# Patient Record
Sex: Male | Born: 1990 | Race: White | Hispanic: No | Marital: Single | Smoking: Current every day smoker
Health system: Southern US, Community
[De-identification: ages and names within clinical notes are randomized; demographics above are authoritative.]

---

## 2009-03-26 ENCOUNTER — Emergency Department (HOSPITAL_COMMUNITY): Admission: EM | Admit: 2009-03-26 | Discharge: 2009-03-26 | Payer: Self-pay | Admitting: Emergency Medicine

## 2010-12-15 LAB — URINALYSIS, ROUTINE W REFLEX MICROSCOPIC
Glucose, UA: NEGATIVE mg/dL
Leukocytes, UA: NEGATIVE
Nitrite: NEGATIVE
pH: >9 — ABNORMAL HIGH (ref 5.0–8.0)

## 2010-12-15 LAB — DIFFERENTIAL
Basophils Relative: 0 % (ref 0–1)
Eosinophils Absolute: 0 10*3/uL (ref 0.0–0.7)
Eosinophils Relative: 0 % (ref 0–5)
Lymphs Abs: 0.3 10*3/uL — ABNORMAL LOW (ref 0.7–4.0)
Monocytes Absolute: 0.4 10*3/uL (ref 0.1–1.0)
Monocytes Relative: 6 % (ref 3–12)
Neutrophils Relative %: 90 % — ABNORMAL HIGH (ref 43–77)

## 2010-12-15 LAB — CBC
Hemoglobin: 15.5 g/dL (ref 13.0–17.0)
RBC: 4.81 MIL/uL (ref 4.22–5.81)
RDW: 12.2 % (ref 11.5–15.5)

## 2010-12-15 LAB — COMPREHENSIVE METABOLIC PANEL
ALT: 23 U/L (ref 0–53)
AST: 27 U/L (ref 0–37)
Alkaline Phosphatase: 64 U/L (ref 39–117)
CO2: 25 mEq/L (ref 19–32)
Calcium: 9.2 mg/dL (ref 8.4–10.5)
GFR calc Af Amer: >60 mL/min (ref >60)
Glucose, Bld: 115 mg/dL — ABNORMAL HIGH (ref 70–99)
Potassium: 3.5 mEq/L (ref 3.5–5.1)
Sodium: 136 mEq/L (ref 135–145)
Total Protein: 7.1 g/dL (ref 6.0–8.3)

## 2010-12-15 LAB — URINE MICROSCOPIC-ADD ON

## 2011-09-24 ENCOUNTER — Emergency Department (HOSPITAL_COMMUNITY): Payer: BC Managed Care – PPO

## 2011-09-24 ENCOUNTER — Encounter (HOSPITAL_COMMUNITY): Payer: Self-pay | Admitting: *Deleted

## 2011-09-24 ENCOUNTER — Emergency Department (HOSPITAL_COMMUNITY)
Admission: EM | Admit: 2011-09-24 | Discharge: 2011-09-25 | Discharge status: Against Medical Advice | Payer: BC Managed Care – PPO | Attending: Emergency Medicine | Admitting: Emergency Medicine

## 2011-09-24 DIAGNOSIS — M545 Low back pain, unspecified: Secondary | ICD-10-CM | POA: Insufficient documentation

## 2011-09-24 DIAGNOSIS — R109 Unspecified abdominal pain: Secondary | ICD-10-CM | POA: Insufficient documentation

## 2011-09-24 DIAGNOSIS — S60219A Contusion of unspecified wrist, initial encounter: Secondary | ICD-10-CM | POA: Insufficient documentation

## 2011-09-24 DIAGNOSIS — IMO0002 Reserved for concepts with insufficient information to code with codable children: Secondary | ICD-10-CM | POA: Insufficient documentation

## 2011-09-24 DIAGNOSIS — F17200 Nicotine dependence, unspecified, uncomplicated: Secondary | ICD-10-CM | POA: Insufficient documentation

## 2011-09-24 DIAGNOSIS — M542 Cervicalgia: Secondary | ICD-10-CM | POA: Insufficient documentation

## 2011-09-24 DIAGNOSIS — M25539 Pain in unspecified wrist: Secondary | ICD-10-CM | POA: Insufficient documentation

## 2011-09-24 DIAGNOSIS — IMO0001 Reserved for inherently not codable concepts without codable children: Secondary | ICD-10-CM | POA: Insufficient documentation

## 2011-09-24 LAB — DIFFERENTIAL
Basophils Absolute: 0.1 10*3/uL (ref 0.0–0.1)
Basophils Relative: 1 % (ref 0–1)
Eosinophils Absolute: 0.1 10*3/uL (ref 0.0–0.7)
Eosinophils Relative: 1 % (ref 0–5)
Lymphs Abs: 1.6 10*3/uL (ref 0.7–4.0)
Neutrophils Relative %: 73 % (ref 43–77)

## 2011-09-24 LAB — CBC
MCH: 31.5 pg (ref 26.0–34.0)
MCHC: 35.2 g/dL (ref 30.0–36.0)
MCV: 89.4 fL (ref 78.0–100.0)
Platelets: 203 10*3/uL (ref 150–400)
RBC: 4.83 MIL/uL (ref 4.22–5.81)
RDW: 12.7 % (ref 11.5–15.5)
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
## 2011-09-25 LAB — URINALYSIS, ROUTINE W REFLEX MICROSCOPIC
Hgb urine dipstick: NEGATIVE
Nitrite: NEGATIVE
Specific Gravity, Urine: 1.015 (ref 1.005–1.030)
Urobilinogen, UA: 0.2 mg/dL (ref 0.0–1.0)
pH: >9 — ABNORMAL HIGH (ref 5.0–8.0)

## 2011-09-25 LAB — COMPREHENSIVE METABOLIC PANEL
ALT: 9 U/L (ref 0–53)
Albumin: 4.3 g/dL (ref 3.5–5.2)
Alkaline Phosphatase: 53 U/L (ref 39–117)
Calcium: 9.7 mg/dL (ref 8.4–10.5)
GFR calc Af Amer: >90 mL/min (ref >90)
Glucose, Bld: 105 mg/dL — ABNORMAL HIGH (ref 70–99)
Potassium: 3.7 mEq/L (ref 3.5–5.1)
Sodium: 139 mEq/L (ref 135–145)
Total Protein: 7.2 g/dL (ref 6.0–8.3)

## 2011-09-25 LAB — URINE MICROSCOPIC-ADD ON
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
## 2015-04-29 ENCOUNTER — Encounter (HOSPITAL_COMMUNITY): Payer: Self-pay | Admitting: *Deleted

## 2015-04-29 ENCOUNTER — Emergency Department (HOSPITAL_COMMUNITY): Payer: No Typology Code available for payment source

## 2015-04-29 ENCOUNTER — Emergency Department (HOSPITAL_COMMUNITY)
Admission: EM | Admit: 2015-04-29 | Discharge: 2015-04-29 | Disposition: A | Discharge status: Home / Self Care | Payer: No Typology Code available for payment source | Attending: Emergency Medicine | Admitting: Emergency Medicine

## 2015-04-29 DIAGNOSIS — Y92410 Unspecified street and highway as the place of occurrence of the external cause: Secondary | ICD-10-CM | POA: Insufficient documentation

## 2015-04-29 DIAGNOSIS — S199XXA Unspecified injury of neck, initial encounter: Secondary | ICD-10-CM | POA: Insufficient documentation

## 2015-04-29 DIAGNOSIS — Z720 Tobacco use: Secondary | ICD-10-CM | POA: Insufficient documentation

## 2015-04-29 DIAGNOSIS — Y998 Other external cause status: Secondary | ICD-10-CM | POA: Insufficient documentation

## 2015-04-29 DIAGNOSIS — Y9389 Activity, other specified: Secondary | ICD-10-CM | POA: Insufficient documentation

## 2015-04-29 DIAGNOSIS — M542 Cervicalgia: Secondary | ICD-10-CM

## 2015-04-29 MED ORDER — IBUPROFEN 600 MG PO TABS: 600.0000 mg | ORAL_TABLET | Freq: Three times a day (TID) | ORAL | Status: AC | PRN

## 2015-04-29 MED ORDER — IBUPROFEN 400 MG PO TABS
600.0000 mg | ORAL_TABLET | Freq: Once | ORAL | Status: AC
  Administered 2015-04-29: 600 mg via ORAL
  Filled 2015-04-29: qty 2

## 2015-04-29 MED ORDER — CYCLOBENZAPRINE HCL 10 MG PO TABS
10.0000 mg | ORAL_TABLET | Freq: Once | ORAL | Status: AC
  Administered 2015-04-29: 10 mg via ORAL
  Filled 2015-04-29: qty 1
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
## 2015-04-29 NOTE — Discharge Instructions (Signed)

## 2015-05-01 ENCOUNTER — Emergency Department (HOSPITAL_COMMUNITY): Payer: No Typology Code available for payment source

## 2015-05-01 ENCOUNTER — Encounter (HOSPITAL_COMMUNITY): Payer: Self-pay | Admitting: *Deleted

## 2015-05-01 ENCOUNTER — Emergency Department (HOSPITAL_COMMUNITY)
Admission: EM | Admit: 2015-05-01 | Discharge: 2015-05-01 | Disposition: A | Discharge status: Home / Self Care | Payer: No Typology Code available for payment source | Attending: Emergency Medicine | Admitting: Emergency Medicine

## 2015-05-01 DIAGNOSIS — Y9389 Activity, other specified: Secondary | ICD-10-CM | POA: Insufficient documentation

## 2015-05-01 DIAGNOSIS — Y92410 Unspecified street and highway as the place of occurrence of the external cause: Secondary | ICD-10-CM | POA: Insufficient documentation

## 2015-05-01 DIAGNOSIS — S060X1A Concussion with loss of consciousness of 30 minutes or less, initial encounter: Secondary | ICD-10-CM

## 2015-05-01 DIAGNOSIS — Z720 Tobacco use: Secondary | ICD-10-CM | POA: Diagnosis not present

## 2015-05-01 DIAGNOSIS — S239XXA Sprain of unspecified parts of thorax, initial encounter: Secondary | ICD-10-CM | POA: Diagnosis not present

## 2015-05-01 DIAGNOSIS — S161XXA Strain of muscle, fascia and tendon at neck level, initial encounter: Secondary | ICD-10-CM | POA: Insufficient documentation

## 2015-05-01 DIAGNOSIS — S29012A Strain of muscle and tendon of back wall of thorax, initial encounter: Secondary | ICD-10-CM | POA: Insufficient documentation

## 2015-05-01 DIAGNOSIS — Y998 Other external cause status: Secondary | ICD-10-CM | POA: Diagnosis not present

## 2015-05-01 DIAGNOSIS — S233XXA Sprain of ligaments of thoracic spine, initial encounter: Secondary | ICD-10-CM

## 2015-05-01 DIAGNOSIS — S0990XA Unspecified injury of head, initial encounter: Secondary | ICD-10-CM | POA: Diagnosis present
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
## 2015-05-01 NOTE — ED Notes (Signed)
Pt made aware to return if symptoms worsen or if any life threatening symptoms occur.   

## 2015-05-01 NOTE — Discharge Instructions (Signed)
Take acetaminophen, ibuprofen, or naproxen as needed for pain.  Concussion A concussion, or closed-head injury, is a brain injury caused by a direct blow to the head or by a quick and sudden movement (jolt) of the head or neck. Concussions are usually not life-threatening. Even so, the effects of a concussion can be serious. If you have had a concussion before, you are more likely to experience concussion-like symptoms after a direct blow to the head.  CAUSES  Direct blow to the head, such as from running into another player during a soccer game, being hit in a fight, or hitting your head on a hard surface.  A jolt of the head or neck that causes the brain to move back and forth inside the skull, such as in a car crash. SIGNS AND SYMPTOMS The signs of a concussion can be hard to notice. Early on, they may be missed by you, family members, and health care providers. You may look fine but act or feel differently. Symptoms are usually temporary, but they may last for days, weeks, or even longer. Some symptoms may appear right away while others may not show up for hours or days. Every head injury is different. Symptoms include:  Mild to moderate headaches that will not go away.  A feeling of pressure inside your head.  Having more trouble than usual:  Learning or remembering things you have heard.  Answering questions.  Paying attention or concentrating.  Organizing daily tasks.  Making decisions and solving problems.  Slowness in thinking, acting or reacting, speaking, or reading.  Getting lost or being easily confused.  Feeling tired all the time or lacking energy (fatigued).  Feeling drowsy.  Sleep disturbances.  Sleeping more than usual.  Sleeping less than usual.  Trouble falling asleep.  Trouble sleeping (insomnia).  Loss of balance or feeling lightheaded or dizzy.  Nausea or vomiting.  Numbness or tingling.  Increased sensitivity  to:  Sounds.  Lights.  Distractions.  Vision problems or eyes that tire easily.  Diminished sense of taste or smell.  Ringing in the ears.  Mood changes such as feeling sad or anxious.  Becoming easily irritated or angry for little or no reason.  Lack of motivation.  Seeing or hearing things other people do not see or hear (hallucinations). DIAGNOSIS Your health care provider can usually diagnose a concussion based on a description of your injury and symptoms. He or she will ask whether you passed out (lost consciousness) and whether you are having trouble remembering events that happened right before and during your injury. Your evaluation might include:  A brain scan to look for signs of injury to the brain. Even if the test shows no injury, you may still have a concussion.  Blood tests to be sure other problems are not present. TREATMENT  Concussions are usually treated in an emergency department, in urgent care, or at a clinic. You may need to stay in the hospital overnight for further treatment.  Tell your health care provider if you are taking any medicines, including prescription medicines, over-the-counter medicines, and natural remedies. Some medicines, such as blood thinners (anticoagulants) and aspirin, may increase the chance of complications. Also tell your health care provider whether you have had alcohol or are taking illegal drugs. This information may affect treatment.  Your health care provider will send you home with important instructions to follow.  How fast you will recover from a concussion depends on many factors. These factors include how severe your  concussion is, what part of your brain was injured, your age, and how healthy you were before the concussion.  Most people with mild injuries recover fully. Recovery can take time. In general, recovery is slower in older persons. Also, persons who have had a concussion in the past or have other medical  problems may find that it takes longer to recover from their current injury. HOME CARE INSTRUCTIONS General Instructions  Carefully follow the directions your health care provider gave you.  Only take over-the-counter or prescription medicines for pain, discomfort, or fever as directed by your health care provider.  Take only those medicines that your health care provider has approved.  Do not drink alcohol until your health care provider says you are well enough to do so. Alcohol and certain other drugs may slow your recovery and can put you at risk of further injury.  If it is harder than usual to remember things, write them down.  If you are easily distracted, try to do one thing at a time. For example, do not try to watch TV while fixing dinner.  Talk with family members or close friends when making important decisions.  Keep all follow-up appointments. Repeated evaluation of your symptoms is recommended for your recovery.  Watch your symptoms and tell others to do the same. Complications sometimes occur after a concussion. Older adults with a brain injury may have a higher risk of serious complications, such as a blood clot on the brain.  Tell your teachers, school nurse, school counselor, coach, athletic trainer, or work Production designer, theatre/television/film about your injury, symptoms, and restrictions. Tell them about what you can or cannot do. They should watch for:  Increased problems with attention or concentration.  Increased difficulty remembering or learning new information.  Increased time needed to complete tasks or assignments.  Increased irritability or decreased ability to cope with stress.  Increased symptoms.  Rest. Rest helps the brain to heal. Make sure you:  Get plenty of sleep at night. Avoid staying up late at night.  Keep the same bedtime hours on weekends and weekdays.  Rest during the day. Take daytime naps or rest breaks when you feel tired.  Limit activities that require a  lot of thought or concentration. These include:  Doing homework or job-related work.  Watching TV.  Working on the computer.  Avoid any situation where there is potential for another head injury (football, hockey, soccer, basketball, martial arts, downhill snow sports and horseback riding). Your condition will get worse every time you experience a concussion. You should avoid these activities until you are evaluated by the appropriate follow-up health care providers. Returning To Your Regular Activities You will need to return to your normal activities slowly, not all at once. You must give your body and brain enough time for recovery.  Do not return to sports or other athletic activities until your health care provider tells you it is safe to do so.  Ask your health care provider when you can drive, ride a bicycle, or operate heavy machinery. Your ability to react may be slower after a brain injury. Never do these activities if you are dizzy.  Ask your health care provider about when you can return to work or school. Preventing Another Concussion It is very important to avoid another brain injury, especially before you have recovered. In rare cases, another injury can lead to permanent brain damage, brain swelling, or death. The risk of this is greatest during the first 7-10 days after  a head injury. Avoid injuries by:  Wearing a seat belt when riding in a car.  Drinking alcohol only in moderation.  Wearing a helmet when biking, skiing, skateboarding, skating, or doing similar activities.  Avoiding activities that could lead to a second concussion, such as contact or recreational sports, until your health care provider says it is okay.  Taking safety measures in your home.  Remove clutter and tripping hazards from floors and stairways.  Use grab bars in bathrooms and handrails by stairs.  Place non-slip mats on floors and in bathtubs.  Improve lighting in dim areas. SEEK MEDICAL  CARE IF:  You have increased problems paying attention or concentrating.  You have increased difficulty remembering or learning new information.  You need more time to complete tasks or assignments than before.  You have increased irritability or decreased ability to cope with stress.  You have more symptoms than before. Seek medical care if you have any of the following symptoms for more than 2 weeks after your injury:  Lasting (chronic) headaches.  Dizziness or balance problems.  Nausea.  Vision problems.  Increased sensitivity to noise or light.  Depression or mood swings.  Anxiety or irritability.  Memory problems.  Difficulty concentrating or paying attention.  Sleep problems.  Feeling tired all the time. SEEK IMMEDIATE MEDICAL CARE IF:  You have severe or worsening headaches. These may be a sign of a blood clot in the brain.  You have weakness (even if only in one hand, leg, or part of the face).  You have numbness.  You have decreased coordination.  You vomit repeatedly.  You have increased sleepiness.  One pupil is larger than the other.  You have convulsions.  You have slurred speech.  You have increased confusion. This may be a sign of a blood clot in the brain.  You have increased restlessness, agitation, or irritability.  You are unable to recognize people or places.  You have neck pain.  It is difficult to wake you up.  You have unusual behavior changes.  You lose consciousness. MAKE SURE YOU:  Understand these instructions.  Will watch your condition.  Will get help right away if you are not doing well or get worse. Document Released: 11/15/2003 Document Revised: 08/30/2013 Document Reviewed: 03/17/2013 [employer] Patient Information 2015 [place], [place]. This information is not intended to replace advice given to you by your health care provider. Make sure you discuss any questions you have with your health care  provider.  Cervical Sprain A cervical sprain is an injury in the neck in which the strong, fibrous tissues (ligaments) that connect your neck bones stretch or tear. Cervical sprains can range from mild to severe. Severe cervical sprains can cause the neck vertebrae to be unstable. This can lead to damage of the spinal cord and can result in serious nervous system problems. The amount of time it takes for a cervical sprain to get better depends on the cause and extent of the injury. Most cervical sprains heal in 1 to 3 weeks. CAUSES  Severe cervical sprains may be caused by:   Contact sport injuries (such as from football, rugby, wrestling, hockey, auto racing, gymnastics, diving, martial arts, or boxing).   Motor vehicle collisions.   Whiplash injuries. This is an injury from a sudden forward and backward whipping movement of the head and neck.  Falls.  Mild cervical sprains may be caused by:   Being in an awkward position, such as while cradling a telephone  between your ear and shoulder.   Sitting in a chair that does not offer proper support.   Working at a poorly Marketing executive station.   Looking up or down for long periods of time.  SYMPTOMS   Pain, soreness, stiffness, or a burning sensation in the front, back, or sides of the neck. This discomfort may develop immediately after the injury or slowly, 24 hours or more after the injury.   Pain or tenderness directly in the middle of the back of the neck.   Shoulder or upper back pain.   Limited ability to move the neck.   Headache.   Dizziness.   Weakness, numbness, or tingling in the hands or arms.   Muscle spasms.   Difficulty swallowing or chewing.   Tenderness and swelling of the neck.  DIAGNOSIS  Most of the time your health care provider can diagnose a cervical sprain by taking your history and doing a physical exam. Your health care provider will ask about previous neck injuries and any known  neck problems, such as arthritis in the neck. X-rays may be taken to find out if there are any other problems, such as with the bones of the neck. Other tests, such as a CT scan or MRI, may also be needed.  TREATMENT  Treatment depends on the severity of the cervical sprain. Mild sprains can be treated with rest, keeping the neck in place (immobilization), and pain medicines. Severe cervical sprains are immediately immobilized. Further treatment is done to help with pain, muscle spasms, and other symptoms and may include:  Medicines, such as pain relievers, numbing medicines, or muscle relaxants.   Physical therapy. This may involve stretching exercises, strengthening exercises, and posture training. Exercises and improved posture can help stabilize the neck, strengthen muscles, and help stop symptoms from returning.  HOME CARE INSTRUCTIONS   Put ice on the injured area.   Put ice in a plastic bag.   Place a towel between your skin and the bag.   Leave the ice on for 15-20 minutes, 3-4 times a day.   If your injury was severe, you may have been given a cervical collar to wear. A cervical collar is a two-piece collar designed to keep your neck from moving while it heals.  Do not remove the collar unless instructed by your health care provider.  If you have long hair, keep it outside of the collar.  Ask your health care provider before making any adjustments to your collar. Minor adjustments may be required over time to improve comfort and reduce pressure on your chin or on the back of your head.  Ifyou are allowed to remove the collar for cleaning or bathing, follow your health care provider's instructions on how to do so safely.  Keep your collar clean by wiping it with mild soap and water and drying it completely. If the collar you have been given includes removable pads, remove them every 1-2 days and hand wash them with soap and water. Allow them to air dry. They should be  completely dry before you wear them in the collar.  If you are allowed to remove the collar for cleaning and bathing, wash and dry the skin of your neck. Check your skin for irritation or sores. If you see any, tell your health care provider.  Do not drive while wearing the collar.   Only take over-the-counter or prescription medicines for pain, discomfort, or fever as directed by your health care provider.  Keep all follow-up appointments as directed by your health care provider.   Keep all physical therapy appointments as directed by your health care provider.   Make any needed adjustments to your workstation to promote good posture.   Avoid positions and activities that make your symptoms worse.   Warm up and stretch before being active to help prevent problems.  SEEK MEDICAL CARE IF:   Your pain is not controlled with medicine.   You are unable to decrease your pain medicine over time as planned.   Your activity level is not improving as expected.  SEEK IMMEDIATE MEDICAL CARE IF:   You develop any bleeding.  You develop stomach upset.  You have signs of an allergic reaction to your medicine.   Your symptoms get worse.   You develop new, unexplained symptoms.   You have numbness, tingling, weakness, or paralysis in any part of your body.  MAKE SURE YOU:   Understand these instructions.  Will watch your condition.  Will get help right away if you are not doing well or get worse. Document Released: 06/22/2007 Document Revised: 08/30/2013 Document Reviewed: 03/02/2013 [facility] Patient Information 2015 [place], [place]. This information is not intended to replace advice given to you by your health care provider. Make sure you discuss any questions you have with your health care provider.
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
